# Patient Record
Sex: Male | Born: 1975 | Race: Black or African American | Hispanic: No | Marital: Single | Smoking: Former smoker
Health system: Southern US, Community
[De-identification: ages and names within clinical notes are randomized; demographics above are authoritative.]

---

## 2000-01-07 ENCOUNTER — Emergency Department (HOSPITAL_COMMUNITY): Admission: EM | Admit: 2000-01-07 | Discharge: 2000-01-07 | Payer: Self-pay | Admitting: Emergency Medicine

## 2000-01-13 ENCOUNTER — Emergency Department (HOSPITAL_COMMUNITY): Admission: EM | Admit: 2000-01-13 | Discharge: 2000-01-13 | Payer: Self-pay | Admitting: Emergency Medicine

## 2002-07-26 ENCOUNTER — Emergency Department (HOSPITAL_COMMUNITY): Admission: EM | Admit: 2002-07-26 | Discharge: 2002-07-26 | Payer: Self-pay | Admitting: Emergency Medicine

## 2002-07-26 ENCOUNTER — Encounter: Payer: Self-pay | Admitting: Emergency Medicine

## 2006-07-23 ENCOUNTER — Emergency Department (HOSPITAL_COMMUNITY): Admission: EM | Admit: 2006-07-23 | Discharge: 2006-07-24 | Payer: Self-pay | Admitting: Emergency Medicine

## 2007-08-04 IMAGING — CT CT HEAD W/O CM
1 series · 16 of 30 positions shown, 20 images · IV contrast (agent unspecified)
Comparison: None.

CLINICAL DATA: Altered level of consciousness.
 HEAD CT WITHOUT CONTRAST:
TECHNIQUE: Contiguous axial images were obtained from the base of the skull through the vertex according to standard protocol without contrast.

[Series 2: head_seq 4.5 h45s st · axial · 0.43mm/px · z∈[-171,-27]mm · 16 of 36 slices shown, 20 images]
[im 2/36  brain]
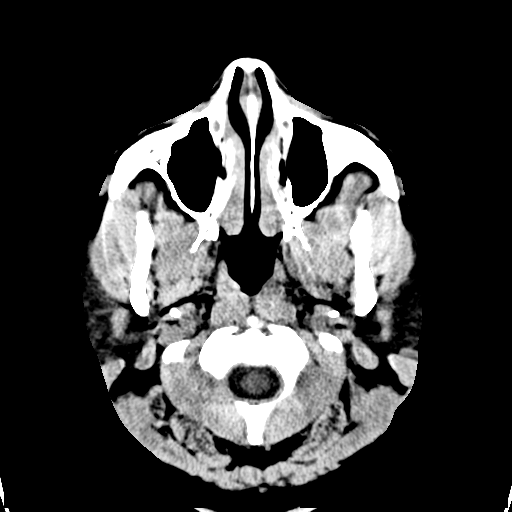
[im 2/36  bone]
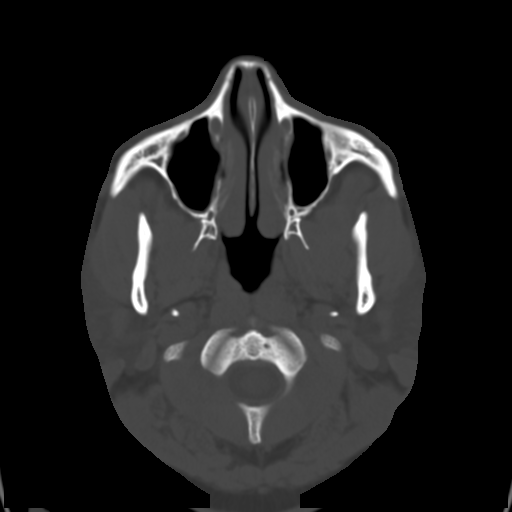
[im 4/36  brain]
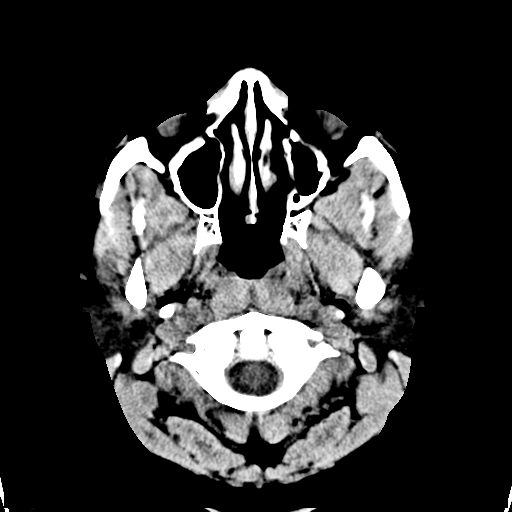
[im 7/36  brain]
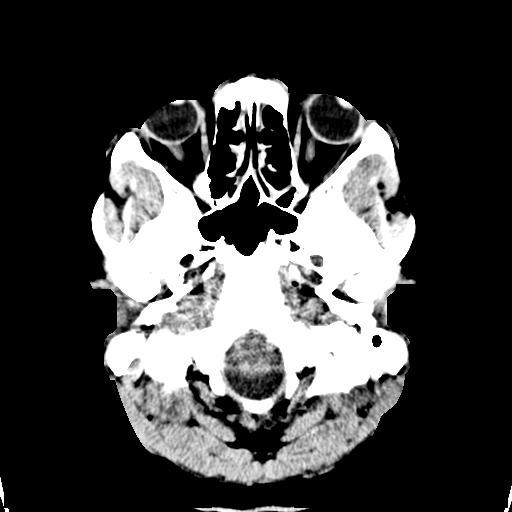
[im 9/36  brain]
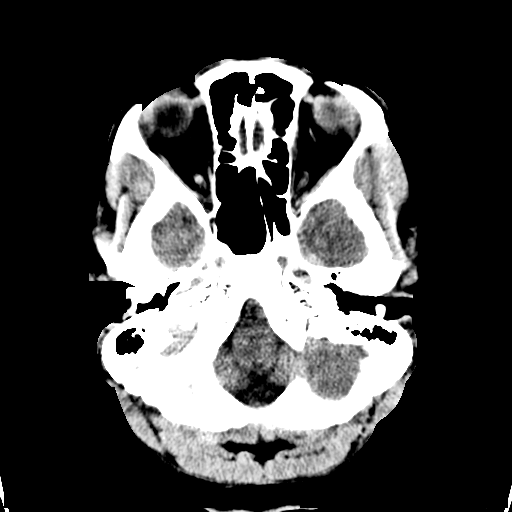
[im 10/36  brain]
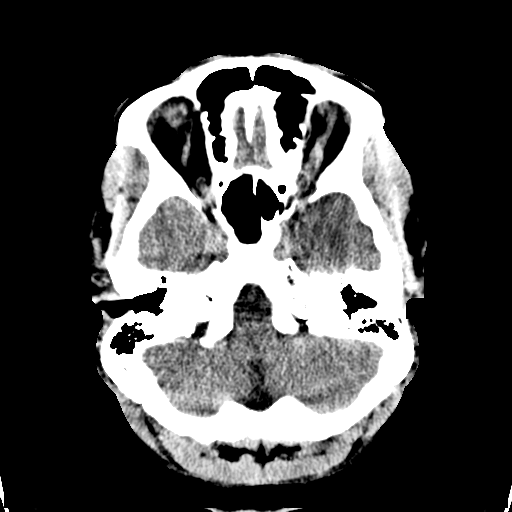
[im 10/36  bone]
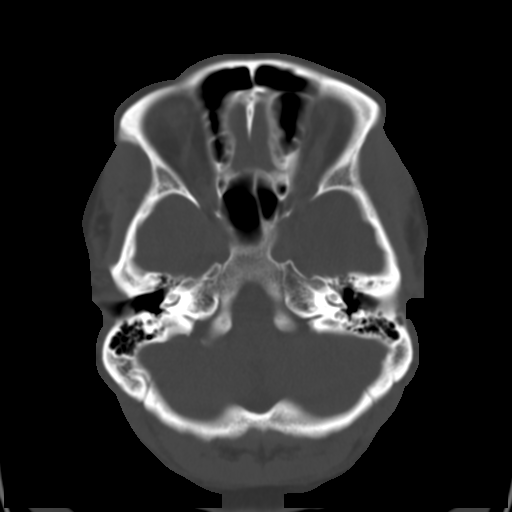
[im 13/36  brain]
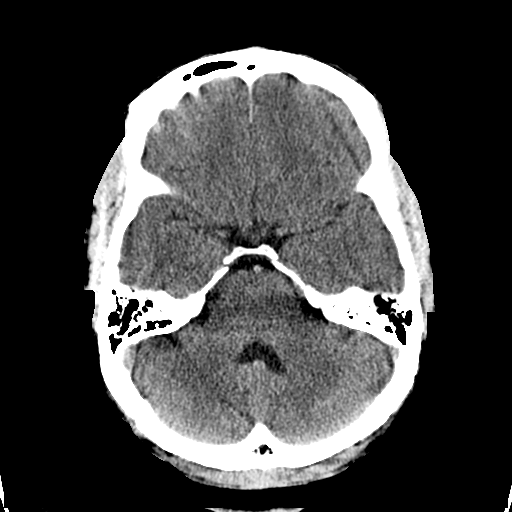
[im 15/36  brain]
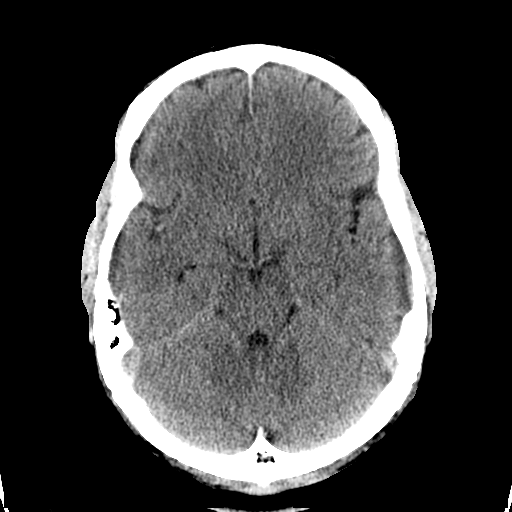
[im 17/36  brain]
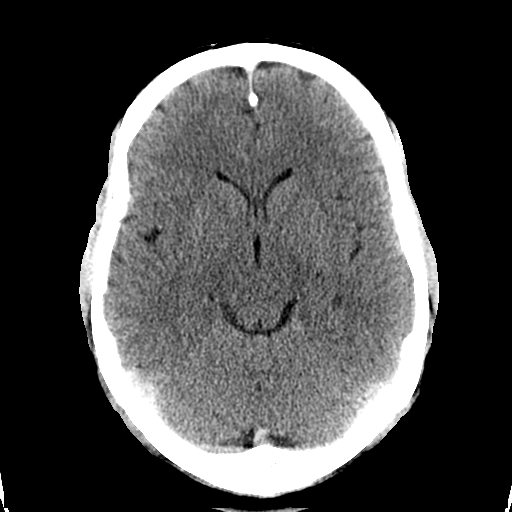
[im 19/36  brain]
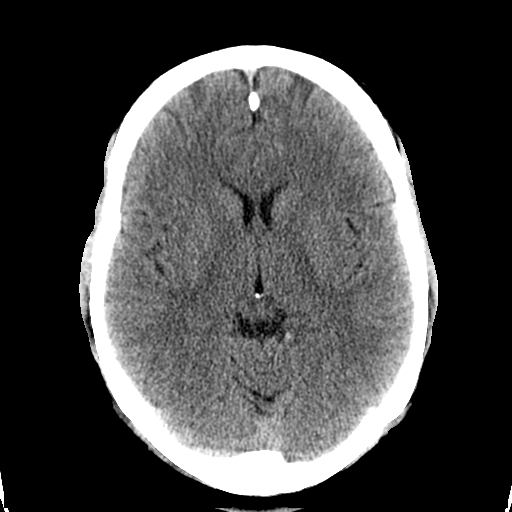
[im 19/36  bone]
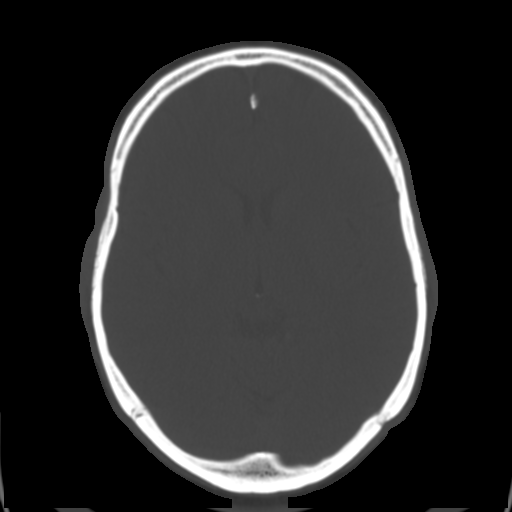
[im 21/36  brain]
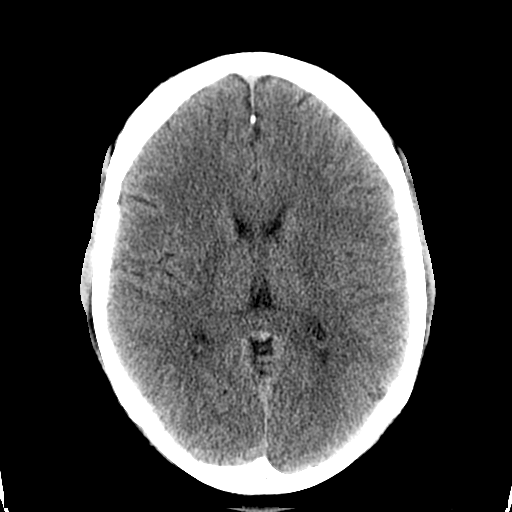
[im 23/36  brain]
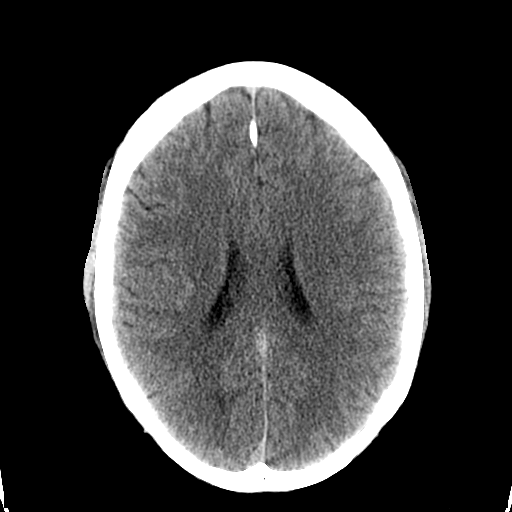
[im 26/36  brain]
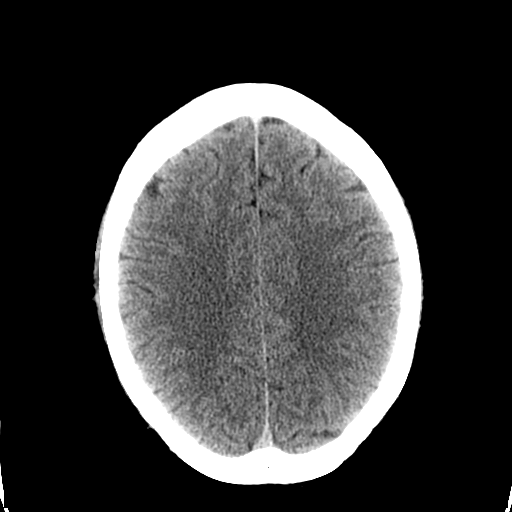
[im 27/36  brain]
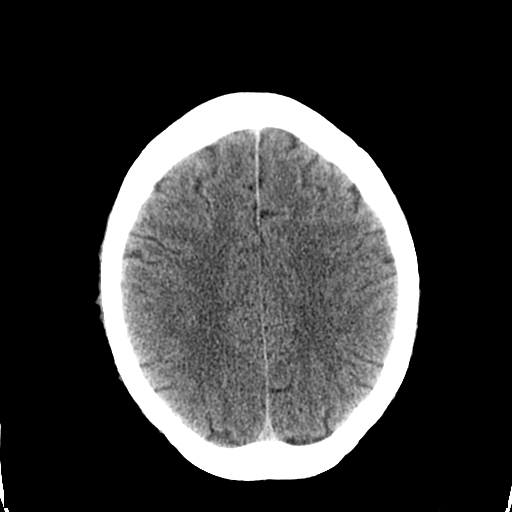
[im 27/36  bone]
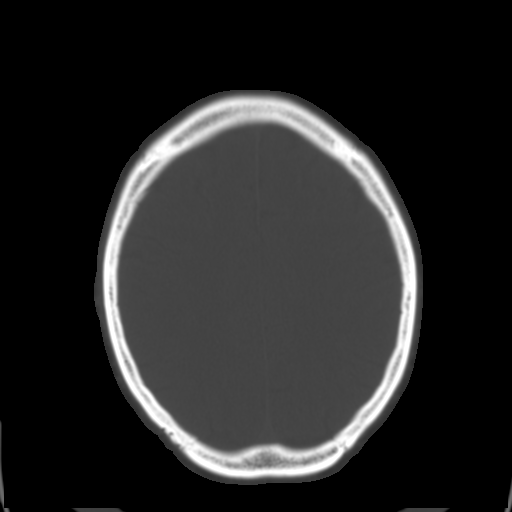
[im 29/36  brain]
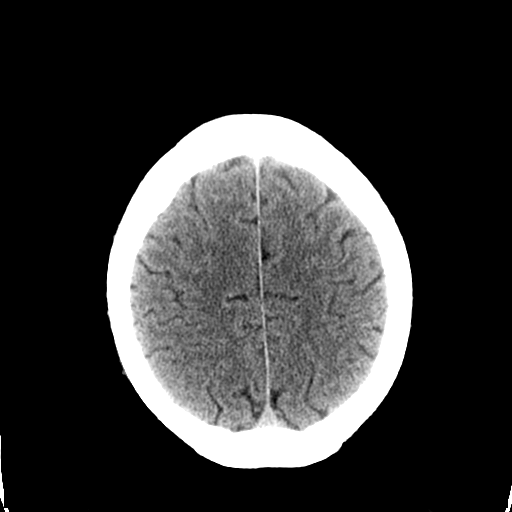
[im 32/36  brain]
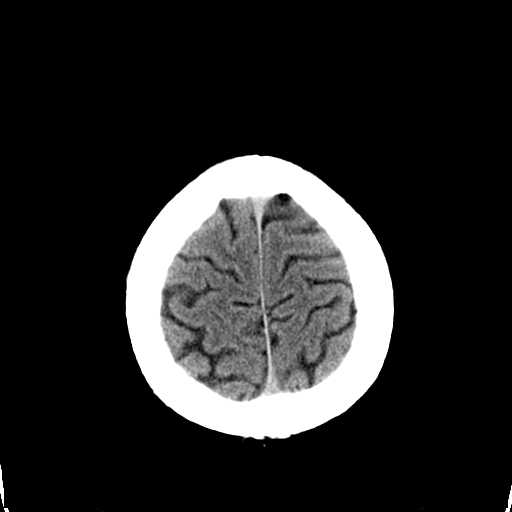
[im 34/36  brain]
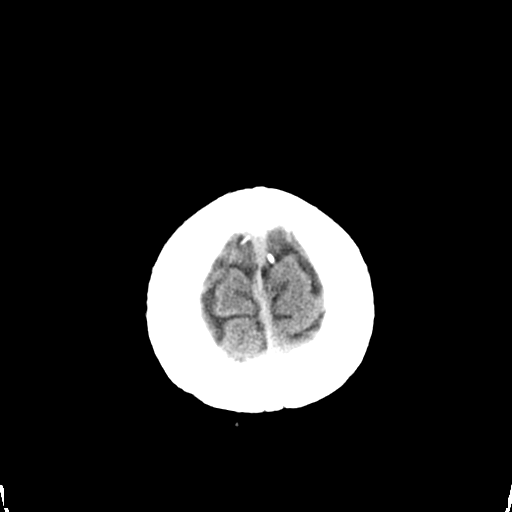

[16 of 30 positions shown; findings below may reference images not displayed]

FINDINGS: The brain appears normal without evidence of hemorrhage, infarct, mass, mass effect, midline shift or abnormal extraaxial fluid collection.  No hydrocephalus.  There is no fracture.  The patient has some mild ethmoid sinus disease.
IMPRESSION: 1. No intracranial abnormality.  
 2. Mild ethmoid sinus disease.

## 2008-01-22 ENCOUNTER — Emergency Department (HOSPITAL_COMMUNITY): Admission: EM | Admit: 2008-01-22 | Discharge: 2008-01-22 | Payer: Self-pay | Admitting: Emergency Medicine

## 2017-05-13 ENCOUNTER — Encounter (HOSPITAL_COMMUNITY): Payer: Self-pay | Admitting: Emergency Medicine

## 2017-05-13 ENCOUNTER — Emergency Department (HOSPITAL_COMMUNITY)
Admission: EM | Admit: 2017-05-13 | Discharge: 2017-05-13 | Disposition: A | Discharge status: Home / Self Care | Payer: No Typology Code available for payment source | Attending: Emergency Medicine | Admitting: Emergency Medicine

## 2017-05-13 DIAGNOSIS — Y999 Unspecified external cause status: Secondary | ICD-10-CM | POA: Insufficient documentation

## 2017-05-13 DIAGNOSIS — F17210 Nicotine dependence, cigarettes, uncomplicated: Secondary | ICD-10-CM | POA: Diagnosis not present

## 2017-05-13 DIAGNOSIS — Y92410 Unspecified street and highway as the place of occurrence of the external cause: Secondary | ICD-10-CM | POA: Diagnosis not present

## 2017-05-13 DIAGNOSIS — M542 Cervicalgia: Secondary | ICD-10-CM | POA: Diagnosis not present

## 2017-05-13 DIAGNOSIS — Y9389 Activity, other specified: Secondary | ICD-10-CM | POA: Diagnosis not present

## 2017-05-13 MED ORDER — NAPROXEN 500 MG PO TABS: 500.0000 mg | ORAL_TABLET | Freq: Two times a day (BID) | ORAL | 0 refills | Status: DC

## 2017-05-13 MED ORDER — CYCLOBENZAPRINE HCL 10 MG PO TABS: 10.0000 mg | ORAL_TABLET | Freq: Two times a day (BID) | ORAL | 0 refills | Status: DC | PRN
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
## 2017-05-13 NOTE — ED Notes (Signed)
ED Provider at bedside. 
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
## 2017-11-19 ENCOUNTER — Emergency Department (HOSPITAL_COMMUNITY): Payer: Self-pay

## 2017-11-19 ENCOUNTER — Emergency Department (HOSPITAL_COMMUNITY)
Admission: EM | Admit: 2017-11-19 | Discharge: 2017-11-19 | Disposition: A | Discharge status: Home / Self Care | Payer: Self-pay | Attending: Physician Assistant | Admitting: Physician Assistant

## 2017-11-19 ENCOUNTER — Other Ambulatory Visit: Payer: Self-pay

## 2017-11-19 DIAGNOSIS — J069 Acute upper respiratory infection, unspecified: Secondary | ICD-10-CM | POA: Insufficient documentation

## 2017-11-19 DIAGNOSIS — R0981 Nasal congestion: Secondary | ICD-10-CM | POA: Insufficient documentation

## 2017-11-19 DIAGNOSIS — F17210 Nicotine dependence, cigarettes, uncomplicated: Secondary | ICD-10-CM | POA: Insufficient documentation

## 2017-11-19 DIAGNOSIS — J029 Acute pharyngitis, unspecified: Secondary | ICD-10-CM | POA: Insufficient documentation

## 2017-11-19 LAB — RAPID STREP SCREEN (MED CTR MEBANE ONLY): Streptococcus, Group A Screen (Direct): NEGATIVE

## 2017-11-19 MED ORDER — BENZONATATE 100 MG PO CAPS: 100.0000 mg | ORAL_CAPSULE | Freq: Three times a day (TID) | ORAL | 0 refills | Status: DC | PRN

## 2017-11-19 MED ORDER — GUAIFENESIN-CODEINE 100-10 MG/5ML PO SOLN: 5.0000 mL | Freq: Four times a day (QID) | ORAL | 0 refills | Status: DC | PRN

## 2017-11-19 NOTE — ED Provider Notes (Signed)
[facility]-EMERGENCY DEPT Provider Note   CSN: [id number] Arrival date & time: 11/19/17  0908     History   Chief Complaint Chief Complaint  Patient presents with  . Cough  . Sore Throat    HPI Mike Vazquez is a 42 y.o. male.  HPI    42 year old male presenting with cough and sore throat.  He reports is been going on for several weeks.  He reports he and his wife had a similar viral illness a couple weeks ago.  It got a little better and then got acutely worse.  Patient reports that bad cough keeping up at night.  Mild drainage, sore throat.  No fevers.  No nausea no vomiting or diarrhea.  Has been taking over-the-counter medications with no effect.  No past medical history on file.  There are no active problems to display for this patient.   No past surgical history on file.      Home Medications    Prior to Admission medications   Medication Sig Start Date End Date Taking? Authorizing Provider  cyclobenzaprine (FLEXERIL) 10 MG tablet Take 1 tablet (10 mg total) by mouth 2 (two) times daily as needed for muscle spasms. 05/13/17  Yes Felicie MornSmith, David, NP  naproxen (NAPROSYN) 500 MG tablet Take 1 tablet (500 mg total) by mouth 2 (two) times daily. 05/13/17  Yes Felicie MornSmith, David, NP    Family History No family history on file.  Social History Social History   Tobacco Use  . Smoking status: Current Every Day Smoker    Packs/day: 0.50    Types: Cigarettes  . Smokeless tobacco: Never Used  Substance Use Topics  . Alcohol use: No  . Drug use: No     Allergies   Patient has no known allergies.   Review of Systems Review of Systems  Constitutional: Negative for activity change.  HENT: Positive for congestion and sore throat.   Respiratory: Positive for cough. Negative for shortness of breath.   Cardiovascular: Negative for chest pain.  Gastrointestinal: Negative for abdominal pain.  All other systems reviewed and are  negative.    Physical Exam Updated Vital Signs BP (!) 156/97 (BP Location: Left Arm)   Pulse 96   Temp 98.6 F (37 C) (Oral)   Resp 18   SpO2 95%   Physical Exam  Constitutional: He is oriented to person, place, and time. He appears well-nourished.  HENT:  Head: Normocephalic.  Right Ear: Tympanic membrane normal.  Left Ear: Tympanic membrane normal.  Mouth/Throat: Oropharynx is clear and moist. No uvula swelling. No posterior oropharyngeal edema or posterior oropharyngeal erythema.  Eyes: Pupils are equal, round, and reactive to light. Conjunctivae and EOM are normal.  Neck: Normal range of motion. Neck supple.  Cardiovascular: Normal rate.  Pulmonary/Chest: Effort normal.  Neurological: He is oriented to person, place, and time.  Skin: Skin is warm and dry. He is not diaphoretic.  Psychiatric: He has a normal mood and affect. His behavior is normal.  Nursing note and vitals reviewed.    ED Treatments / Results  Labs (all labs ordered are listed, but only abnormal results are displayed) Labs Reviewed  RAPID STREP SCREEN (NOT AT [facility])  CULTURE, GROUP A STREP [facility]    EKG None  Radiology No results found.  Procedures Procedures (including critical care time)  Medications Ordered in ED Medications - No data to display   Initial Impression / Assessment and Plan / ED Course  I have reviewed  the triage vital signs and the nursing notes.  Pertinent labs & imaging results that were available during my care of the patient were reviewed by me and considered in my medical decision making (see chart for details).      42 year old male presenting with cough and sore throat.  He reports is been going on for several weeks.  He reports he and his wife had a similar viral illness a couple weeks ago.  It got a little better and then got acutely worse.  Patient reports that bad cough keeping up at night.  Mild drainage, sore throat.  No fevers.  No nausea no vomiting or  diarrhea.  Has been taking over-the-counter medications with no effect.   Pt CXR negative for acute infiltrate. Patients symptoms are consistent with URI, likely viral etiology. Discussed that antibiotics are not indicated for viral infections. Pt will be discharged with symptomatic treatment.  Verbalizes understanding and is agreeable with plan. Pt is hemodynamically stable & in NAD prior to dc.  Will give cough medicine for daytime and nighttime.  Have him follow-up with primary care.  Patient appears very nontoxic, normal vital signs.  Final Clinical Impressions(s) / ED Diagnoses   Final diagnoses:  None    ED Discharge Orders    None       Abelino Derrick, MD 11/19/17 1616

## 2017-11-19 NOTE — Discharge Instructions (Signed)
We think you likely have a virus causing your symptoms.  Please use his symptomatic care.  Use ibuprofen and Tylenol.  Rest and drink plenty of fluids.

## 2017-11-19 NOTE — ED Triage Notes (Signed)
Pt reports URI s/s x3 weeks. Pt reports no resolution despite OTC. Pt A+OX4, NAD.

## 2017-11-21 LAB — CULTURE, GROUP A STREP (THRC)

## 2018-07-07 ENCOUNTER — Emergency Department (HOSPITAL_COMMUNITY): Payer: Self-pay

## 2018-07-07 ENCOUNTER — Encounter (HOSPITAL_COMMUNITY): Payer: Self-pay | Admitting: Emergency Medicine

## 2018-07-07 ENCOUNTER — Encounter (HOSPITAL_COMMUNITY): Payer: Self-pay | Admitting: *Deleted

## 2018-07-07 ENCOUNTER — Other Ambulatory Visit: Payer: Self-pay

## 2018-07-07 ENCOUNTER — Emergency Department (HOSPITAL_COMMUNITY)
Admission: EM | Admit: 2018-07-07 | Discharge: 2018-07-07 | Disposition: A | Discharge status: Home / Self Care | Payer: Self-pay | Attending: Emergency Medicine | Admitting: Emergency Medicine

## 2018-07-07 DIAGNOSIS — Y939 Activity, unspecified: Secondary | ICD-10-CM | POA: Insufficient documentation

## 2018-07-07 DIAGNOSIS — S0183XA Puncture wound without foreign body of other part of head, initial encounter: Secondary | ICD-10-CM

## 2018-07-07 DIAGNOSIS — S0182XA Laceration with foreign body of other part of head, initial encounter: Secondary | ICD-10-CM | POA: Insufficient documentation

## 2018-07-07 DIAGNOSIS — S0181XA Laceration without foreign body of other part of head, initial encounter: Secondary | ICD-10-CM

## 2018-07-07 DIAGNOSIS — W3400XA Accidental discharge from unspecified firearms or gun, initial encounter: Secondary | ICD-10-CM | POA: Insufficient documentation

## 2018-07-07 DIAGNOSIS — Y999 Unspecified external cause status: Secondary | ICD-10-CM | POA: Insufficient documentation

## 2018-07-07 DIAGNOSIS — Z23 Encounter for immunization: Secondary | ICD-10-CM | POA: Insufficient documentation

## 2018-07-07 DIAGNOSIS — Y929 Unspecified place or not applicable: Secondary | ICD-10-CM | POA: Insufficient documentation

## 2018-07-07 LAB — COMPREHENSIVE METABOLIC PANEL
ALBUMIN: 3.8 g/dL (ref 3.5–5.0)
ALK PHOS: 39 U/L (ref 38–126)
ALT: 24 U/L (ref 0–44)
AST: 12 U/L — AB (ref 15–41)
Anion gap: 8 (ref 5–15)
BILIRUBIN TOTAL: 1.4 mg/dL — AB (ref 0.3–1.2)
BUN: 19 mg/dL (ref 6–20)
CALCIUM: 9.1 mg/dL (ref 8.9–10.3)
CO2: 24 mmol/L (ref 22–32)
CREATININE: 1.3 mg/dL — AB (ref 0.61–1.24)
Chloride: 108 mmol/L (ref 98–111)
GFR calc Af Amer: >60 mL/min (ref >60)
GLUCOSE: 112 mg/dL — AB (ref 70–99)
POTASSIUM: 3.5 mmol/L (ref 3.5–5.1)
Sodium: 140 mmol/L (ref 135–145)
TOTAL PROTEIN: 6.6 g/dL (ref 6.5–8.1)

## 2018-07-07 LAB — CDS SEROLOGY

## 2018-07-07 LAB — CBC
HCT: 44.4 % (ref 39.0–52.0)
HEMOGLOBIN: 14.2 g/dL (ref 13.0–17.0)
MCH: 30.5 pg (ref 26.0–34.0)
MCHC: 32 g/dL (ref 30.0–36.0)
MCV: 95.5 fL (ref 80.0–100.0)
Platelets: 273 10*3/uL (ref 150–400)
RBC: 4.65 MIL/uL (ref 4.22–5.81)
RDW: 12.2 % (ref 11.5–15.5)
WBC: 6.3 10*3/uL (ref 4.0–10.5)
nRBC: 0 % (ref 0.0–0.2)

## 2018-07-07 LAB — ABO/RH: ABO/RH(D): O POS

## 2018-07-07 LAB — PROTIME-INR
INR: 1
Prothrombin Time: 13.1 seconds (ref 11.4–15.2)

## 2018-07-07 LAB — ETHANOL

## 2018-07-07 MED ORDER — LIDOCAINE-EPINEPHRINE (PF) 2 %-1:200000 IJ SOLN
10.0000 mL | Freq: Once | INTRAMUSCULAR | Status: AC
  Administered 2018-07-07: 10 mL
  Filled 2018-07-07: qty 10

## 2018-07-07 MED ORDER — CEPHALEXIN 500 MG PO CAPS: 500.0000 mg | ORAL_CAPSULE | Freq: Three times a day (TID) | ORAL | 0 refills | Status: DC

## 2018-07-07 MED ORDER — TETANUS-DIPHTH-ACELL PERTUSSIS 5-2.5-18.5 LF-MCG/0.5 IM SUSP
0.5000 mL | Freq: Once | INTRAMUSCULAR | Status: AC
  Administered 2018-07-07: 0.5 mL via INTRAMUSCULAR
  Filled 2018-07-07: qty 0.5

## 2018-07-07 MED ORDER — CEPHALEXIN 250 MG PO CAPS
1000.0000 mg | ORAL_CAPSULE | Freq: Once | ORAL | Status: AC
  Administered 2018-07-07: 1000 mg via ORAL
  Filled 2018-07-07: qty 4

## 2018-07-07 NOTE — ED Notes (Signed)
csi at the bedside
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
## 2018-07-07 NOTE — ED Notes (Signed)
Laceration cleaned with soap and water  dsd applied 
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
## 2018-07-07 NOTE — ED Notes (Signed)
bp 135/90

## 2018-07-07 NOTE — ED Notes (Signed)
Pt to ct 

## 2018-07-07 NOTE — ED Notes (Signed)
Portable  Chest xrays and pelvis
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
## 2018-07-07 NOTE — Discharge Instructions (Addendum)
Take acetaminophen or ibuprofen as needed for pain.  Return if signs of infection appear.

## 2018-07-08 LAB — BPAM RBC
BLOOD PRODUCT EXPIRATION DATE: 201912152359
BLOOD PRODUCT EXPIRATION DATE: 201912192359
ISSUE DATE / TIME: 201911210730
ISSUE DATE / TIME: 201911210833
UNIT TYPE AND RH: 5100
Unit Type and Rh: 5100

## 2018-07-08 LAB — TYPE AND SCREEN
ABO/RH(D): O POS
Antibody Screen: NEGATIVE
UNIT DIVISION: 0
Unit division: 0

## 2018-07-11 ENCOUNTER — Encounter (HOSPITAL_COMMUNITY): Payer: Self-pay

## 2018-07-11 ENCOUNTER — Emergency Department (HOSPITAL_COMMUNITY)
Admission: EM | Admit: 2018-07-11 | Discharge: 2018-07-11 | Disposition: A | Discharge status: Home / Self Care | Payer: Self-pay | Attending: Emergency Medicine | Admitting: Emergency Medicine

## 2018-07-11 DIAGNOSIS — F17210 Nicotine dependence, cigarettes, uncomplicated: Secondary | ICD-10-CM | POA: Insufficient documentation

## 2018-07-11 DIAGNOSIS — S0181XD Laceration without foreign body of other part of head, subsequent encounter: Secondary | ICD-10-CM | POA: Insufficient documentation

## 2018-07-11 DIAGNOSIS — W3400XD Accidental discharge from unspecified firearms or gun, subsequent encounter: Secondary | ICD-10-CM | POA: Insufficient documentation

## 2018-07-11 DIAGNOSIS — Z79899 Other long term (current) drug therapy: Secondary | ICD-10-CM | POA: Insufficient documentation

## 2018-07-11 DIAGNOSIS — Z4802 Encounter for removal of sutures: Secondary | ICD-10-CM | POA: Insufficient documentation
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
## 2018-07-11 NOTE — Discharge Instructions (Addendum)
It is normal to have oozing of blood for a day or 2 after your stitches are removed.  If you develop new redness around the wound, have significant swelling or concerns please seek additional medical care and evaluation.  I put on Steri-Strips today to help reinforce the wound and keep it from opening up.  Once these fall off you do not have to put new ones on.  I have given you follow-up with the concussion clinic as needed.  Please call them for an appointment.  If you have any new or concerning symptoms please seek additional medical care and evaluation.
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
## 2018-07-19 ENCOUNTER — Telehealth: Payer: Self-pay | Admitting: General Practice

## 2018-07-19 NOTE — Telephone Encounter (Signed)
Patient was seen at hospital on 11/20. The hospital wrote the patient out of work for 2 weeks. He is requesting to have his FMLA completed and cleared for work. The hospital informed patient he would need to follow up at [facility] with Dr.Smith to have this done, They are unable to do so.   Patient does not have insurance, but can pay the $80 and is aware of that - along with he will get a bill for the rest.   Please advise.

## 2018-07-20 ENCOUNTER — Telehealth: Payer: Self-pay

## 2018-07-20 ENCOUNTER — Ambulatory Visit: Payer: Self-pay | Admitting: Family Medicine

## 2018-07-20 ENCOUNTER — Ambulatory Visit (INDEPENDENT_AMBULATORY_CARE_PROVIDER_SITE_OTHER): Payer: Self-pay | Admitting: Family Medicine

## 2018-07-20 ENCOUNTER — Encounter: Payer: Self-pay | Admitting: Family Medicine

## 2018-07-20 DIAGNOSIS — S0990XA Unspecified injury of head, initial encounter: Secondary | ICD-10-CM

## 2018-07-20 NOTE — Telephone Encounter (Signed)
Scheduled

## 2018-07-20 NOTE — Patient Instructions (Signed)
Good to see you  You will be good to go on Thursday  Please do not hesitate to call us at [phone] if you need us for anything  Happy holidays!  See me otherwise if you need me

## 2018-07-20 NOTE — Telephone Encounter (Signed)
Lets go for 1015 today
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
## 2018-08-15 DIAGNOSIS — Z0279 Encounter for issue of other medical certificate: Secondary | ICD-10-CM

## 2019-10-26 ENCOUNTER — Other Ambulatory Visit: Payer: Self-pay

## 2019-10-26 ENCOUNTER — Encounter (HOSPITAL_COMMUNITY): Payer: Self-pay

## 2019-10-26 ENCOUNTER — Emergency Department (HOSPITAL_COMMUNITY)
Admission: EM | Admit: 2019-10-26 | Discharge: 2019-10-26 | Disposition: A | Discharge status: Home / Self Care | Payer: Self-pay | Attending: Emergency Medicine | Admitting: Emergency Medicine

## 2019-10-26 DIAGNOSIS — Z791 Long term (current) use of non-steroidal anti-inflammatories (NSAID): Secondary | ICD-10-CM | POA: Insufficient documentation

## 2019-10-26 DIAGNOSIS — Z79899 Other long term (current) drug therapy: Secondary | ICD-10-CM | POA: Insufficient documentation

## 2019-10-26 DIAGNOSIS — L0291 Cutaneous abscess, unspecified: Secondary | ICD-10-CM

## 2019-10-26 DIAGNOSIS — L02212 Cutaneous abscess of back [any part, except buttock]: Secondary | ICD-10-CM | POA: Insufficient documentation

## 2019-10-26 DIAGNOSIS — F17210 Nicotine dependence, cigarettes, uncomplicated: Secondary | ICD-10-CM | POA: Insufficient documentation

## 2019-10-26 MED ORDER — BACITRACIN ZINC 500 UNIT/GM EX OINT
TOPICAL_OINTMENT | CUTANEOUS | Status: AC
  Filled 2019-10-26: qty 0.9

## 2019-10-26 MED ORDER — LIDOCAINE-EPINEPHRINE (PF) 2 %-1:200000 IJ SOLN: 10.0000 mL | Freq: Once | INTRAMUSCULAR | Status: DC

## 2019-10-26 MED ORDER — DOXYCYCLINE HYCLATE 100 MG PO CAPS: 100.0000 mg | ORAL_CAPSULE | Freq: Two times a day (BID) | ORAL | 0 refills | Status: DC

## 2019-10-26 MED ORDER — DOXYCYCLINE HYCLATE 100 MG PO TABS
100.0000 mg | ORAL_TABLET | Freq: Once | ORAL | Status: AC
  Administered 2019-10-26: 100 mg via ORAL
  Filled 2019-10-26: qty 1

## 2019-10-26 NOTE — ED Triage Notes (Signed)
Pt reports a "bump" on his back under his L shoulder blade that popped and started draining yesterday. He states that it was draining purulent blood. States that it has stopped draining, but is sore.

## 2019-10-26 NOTE — ED Provider Notes (Signed)
[facility]-EMERGENCY DEPT Provider Note   CSN: [id number] Arrival date & time: 10/26/19  0038     History Chief Complaint  Patient presents with  . Abscess    Mike Vazquez is a 44 y.o. male.  The history is provided by the patient. No language interpreter was used.  Abscess Location:  Torso Torso abscess location:  Upper back Size:  2x1 cm Abscess quality: draining, fluctuance and painful   Red streaking: no   Duration:  1 day Progression:  Worsening Pain details:    Quality:  Pressure   Severity:  Moderate   Timing:  Constant   Progression:  Worsening Chronicity:  New Context: insect bite/sting (possible)   Context: not diabetes, not immunosuppression, not injected drug use and not skin injury   Relieved by:  Nothing Worsened by:  Nothing Ineffective treatments:  None tried Associated symptoms: no fever, no nausea and no vomiting        History reviewed. No pertinent past medical history.  Patient Active Problem List   Diagnosis Date Noted  . Head injury 07/20/2018    History reviewed. No pertinent surgical history.     History reviewed. No pertinent family history.  Social History   Tobacco Use  . Smoking status: Current Every Day Smoker    Packs/day: 0.50    Types: Cigarettes  . Smokeless tobacco: Never Used  Substance Use Topics  . Alcohol use: Never  . Drug use: No    Home Medications Prior to Admission medications   Medication Sig Start Date End Date Taking? Authorizing Provider  benzonatate (TESSALON PERLES) 100 MG capsule Take 1 capsule (100 mg total) by mouth 3 (three) times daily as needed for cough. 11/19/17   Mackuen, Courteney Lyn, MD  cephALEXin (KEFLEX) 500 MG capsule Take 1 capsule (500 mg total) by mouth 3 (three) times daily. 07/07/18   Dione Booze, MD  cyclobenzaprine (FLEXERIL) 10 MG tablet Take 1 tablet (10 mg total) by mouth 2 (two) times daily as needed for muscle spasms. 05/13/17   Felicie Morn, NP    doxycycline (VIBRAMYCIN) 100 MG capsule Take 1 capsule (100 mg total) by mouth 2 (two) times daily. 10/26/19   Roxy Horseman, PA-C  guaiFENesin-codeine 100-10 MG/5ML syrup Take 5 mLs by mouth every 6 (six) hours as needed for cough. 11/19/17   Mackuen, Courteney Lyn, MD  naproxen (NAPROSYN) 500 MG tablet Take 1 tablet (500 mg total) by mouth 2 (two) times daily. 05/13/17   Felicie Morn, NP    Allergies    Patient has no known allergies.  Review of Systems   Review of Systems  Constitutional: Negative for chills and fever.  Gastrointestinal: Negative for nausea and vomiting.  Skin: Positive for color change and wound.    Physical Exam Updated Vital Signs BP (!) 141/96 (BP Location: Right Arm)   Pulse 88   Temp 98.8 F (37.1 C) (Oral)   Resp 14   Ht 5\' 8"  (1.727 m)   Wt 86.2 kg   SpO2 96%   BMI 28.89 kg/m   Physical Exam Vitals and nursing note reviewed.  Constitutional:      General: He is not in acute distress.    Appearance: He is well-developed. He is not ill-appearing.  HENT:     Head: Normocephalic and atraumatic.  Eyes:     Conjunctiva/sclera: Conjunctivae normal.  Cardiovascular:     Rate and Rhythm: Normal rate.  Pulmonary:     Effort: Pulmonary effort is  normal. No respiratory distress.  Abdominal:     General: There is no distension.  Musculoskeletal:     Cervical back: Neck supple.     Comments: Moves all extremities  Skin:    General: Skin is warm and dry.     Comments: 2x1cm draining abscess to left upper back with mild cellulitic changes  Neurological:     Mental Status: He is alert and oriented to person, place, and time.  Psychiatric:        Mood and Affect: Mood normal.        Behavior: Behavior normal.     ED Results / Procedures / Treatments   Labs (all labs ordered are listed, but only abnormal results are displayed) Labs Reviewed - No data to display  EKG None  Radiology No results found.  Procedures .Marland KitchenIncision and  Drainage  Date/Time: 10/26/2019 3:02 AM Performed by: Montine Circle, PA-C Authorized by: Montine Circle, PA-C   Consent:    Consent obtained:  Verbal   Consent given by:  Patient   Risks discussed:  Bleeding, incomplete drainage, pain, damage to other organs and infection   Alternatives discussed:  No treatment Universal protocol:    Procedure explained and questions answered to patient or proxy's satisfaction: yes     Relevant documents present and verified: yes     Test results available and properly labeled: yes     Imaging studies available: yes     Required blood products, implants, devices, and special equipment available: yes     Site/side marked: yes     Immediately prior to procedure a time out was called: yes     Patient identity confirmed:  Verbally with patient Location:    Type:  Abscess   Size:  2x1cm   Location:  Trunk   Trunk location:  Back Procedure type:    Complexity:  Simple Procedure details:    Drainage:  Purulent   Drainage amount:  Moderate Post-procedure details:    Patient tolerance of procedure:  Tolerated well, no immediate complications Comments:     No incision made.  Draining already.  Moderate amount of purulent discharge was expressed with pressure.   (including critical care time)  Medications Ordered in ED Medications  doxycycline (VIBRA-TABS) tablet 100 mg (has no administration in time range)  bacitracin 500 UNIT/GM ointment (  Given 10/26/19 0226)    ED Course  I have reviewed the triage vital signs and the nursing notes.  Pertinent labs & imaging results that were available during my care of the patient were reviewed by me and considered in my medical decision making (see chart for details).    MDM Rules/Calculators/A&P                       Patient with draining abscess to his left upper back.  I was able to express a significant amount of purulent discharge.  Patient given doxycycline.  Return precautions given. Final  Clinical Impression(s) / ED Diagnoses Final diagnoses:  Abscess    Rx / DC Orders ED Discharge Orders         Ordered    doxycycline (VIBRAMYCIN) 100 MG capsule  2 times daily     10/26/19 0226           Montine Circle, PA-C 10/26/19 0305    Palumbo, April, MD 10/26/19 [phone]

## 2020-08-24 ENCOUNTER — Emergency Department (HOSPITAL_COMMUNITY): Payer: HRSA Program

## 2020-08-24 ENCOUNTER — Other Ambulatory Visit: Payer: Self-pay

## 2020-08-24 ENCOUNTER — Encounter (HOSPITAL_COMMUNITY): Payer: Self-pay | Admitting: *Deleted

## 2020-08-24 ENCOUNTER — Emergency Department (HOSPITAL_COMMUNITY)
Admission: EM | Admit: 2020-08-24 | Discharge: 2020-08-24 | Disposition: A | Discharge status: Home / Self Care | Payer: HRSA Program | Attending: Emergency Medicine | Admitting: Emergency Medicine

## 2020-08-24 DIAGNOSIS — J1282 Pneumonia due to coronavirus disease 2019: Secondary | ICD-10-CM | POA: Diagnosis not present

## 2020-08-24 DIAGNOSIS — R509 Fever, unspecified: Secondary | ICD-10-CM | POA: Diagnosis present

## 2020-08-24 DIAGNOSIS — U071 COVID-19: Secondary | ICD-10-CM | POA: Diagnosis not present

## 2020-08-24 DIAGNOSIS — Z87891 Personal history of nicotine dependence: Secondary | ICD-10-CM | POA: Insufficient documentation

## 2020-08-24 LAB — CBC WITH DIFFERENTIAL/PLATELET
Abs Immature Granulocytes: 0.02 10*3/uL (ref 0.00–0.07)
Basophils Absolute: 0 10*3/uL (ref 0.0–0.1)
Basophils Relative: 0 %
Eosinophils Absolute: 0 10*3/uL (ref 0.0–0.5)
Eosinophils Relative: 0 %
HCT: 46.2 % (ref 39.0–52.0)
Hemoglobin: 15.8 g/dL (ref 13.0–17.0)
Immature Granulocytes: 0 %
Lymphocytes Relative: 18 %
Lymphs Abs: 0.9 10*3/uL (ref 0.7–4.0)
MCH: 32 pg (ref 26.0–34.0)
MCHC: 34.2 g/dL (ref 30.0–36.0)
MCV: 93.5 fL (ref 80.0–100.0)
Monocytes Absolute: 0.3 10*3/uL (ref 0.1–1.0)
Monocytes Relative: 6 %
Neutro Abs: 3.8 10*3/uL (ref 1.7–7.7)
Neutrophils Relative %: 76 %
Platelets: 202 10*3/uL (ref 150–400)
RBC: 4.94 MIL/uL (ref 4.22–5.81)
RDW: 11.7 % (ref 11.5–15.5)
WBC: 5 10*3/uL (ref 4.0–10.5)
nRBC: 0 % (ref 0.0–0.2)

## 2020-08-24 LAB — COMPREHENSIVE METABOLIC PANEL
ALT: 32 U/L (ref 0–44)
AST: 47 U/L — ABNORMAL HIGH (ref 15–41)
Albumin: 3.6 g/dL (ref 3.5–5.0)
Alkaline Phosphatase: 39 U/L (ref 38–126)
Anion gap: 9 (ref 5–15)
BUN: 12 mg/dL (ref 6–20)
CO2: 28 mmol/L (ref 22–32)
Calcium: 8.5 mg/dL — ABNORMAL LOW (ref 8.9–10.3)
Chloride: 99 mmol/L (ref 98–111)
Creatinine, Ser: 1.08 mg/dL (ref 0.61–1.24)
GFR, Estimated: >60 mL/min (ref >60)
Glucose, Bld: 116 mg/dL — ABNORMAL HIGH (ref 70–99)
Potassium: 3.8 mmol/L (ref 3.5–5.1)
Sodium: 136 mmol/L (ref 135–145)
Total Bilirubin: 1.2 mg/dL (ref 0.3–1.2)
Total Protein: 7.7 g/dL (ref 6.5–8.1)

## 2020-08-24 LAB — SARS CORONAVIRUS 2 (TAT 6-24 HRS): SARS Coronavirus 2: POSITIVE — AB

## 2020-08-24 LAB — POC SARS CORONAVIRUS 2 AG -  ED
SARS Coronavirus 2 Ag: POSITIVE — AB
SARS Coronavirus 2 Ag: POSITIVE — AB

## 2020-08-24 MED ORDER — ACETAMINOPHEN 325 MG PO TABS
650.0000 mg | ORAL_TABLET | Freq: Once | ORAL | Status: AC | PRN
  Administered 2020-08-24: 650 mg via ORAL
  Filled 2020-08-24: qty 2

## 2020-08-24 MED ORDER — BENZONATATE 100 MG PO CAPS: 100.0000 mg | ORAL_CAPSULE | Freq: Three times a day (TID) | ORAL | 0 refills | Status: AC | PRN

## 2020-08-24 MED ORDER — SODIUM CHLORIDE 0.9 % IV BOLUS: 1000.0000 mL | Freq: Once | INTRAVENOUS | Status: DC

## 2020-08-24 MED ORDER — BENZONATATE 100 MG PO CAPS: 100.0000 mg | ORAL_CAPSULE | Freq: Three times a day (TID) | ORAL | 0 refills | Status: DC | PRN
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
## 2020-08-24 NOTE — ED Triage Notes (Signed)
Cough, fever, body aches, since Thursday

## 2020-08-24 NOTE — Discharge Instructions (Signed)
If you develop high fever, severe cough or cough with blood, trouble breathing, severe headache, neck pain/stiffness, vomiting, or any other new/concerning symptoms then return to the ER for evaluation  

## 2021-09-04 IMAGING — DX DG CHEST 1V PORT
1 series · 1 of 1 positions shown · non-contrast
Comparison: Chest radiograph dated 11/19/2017

CLINICAL DATA: 44-year-old male with cough and fever

EXAM:
PORTABLE CHEST 1 VIEW

[chest ap]
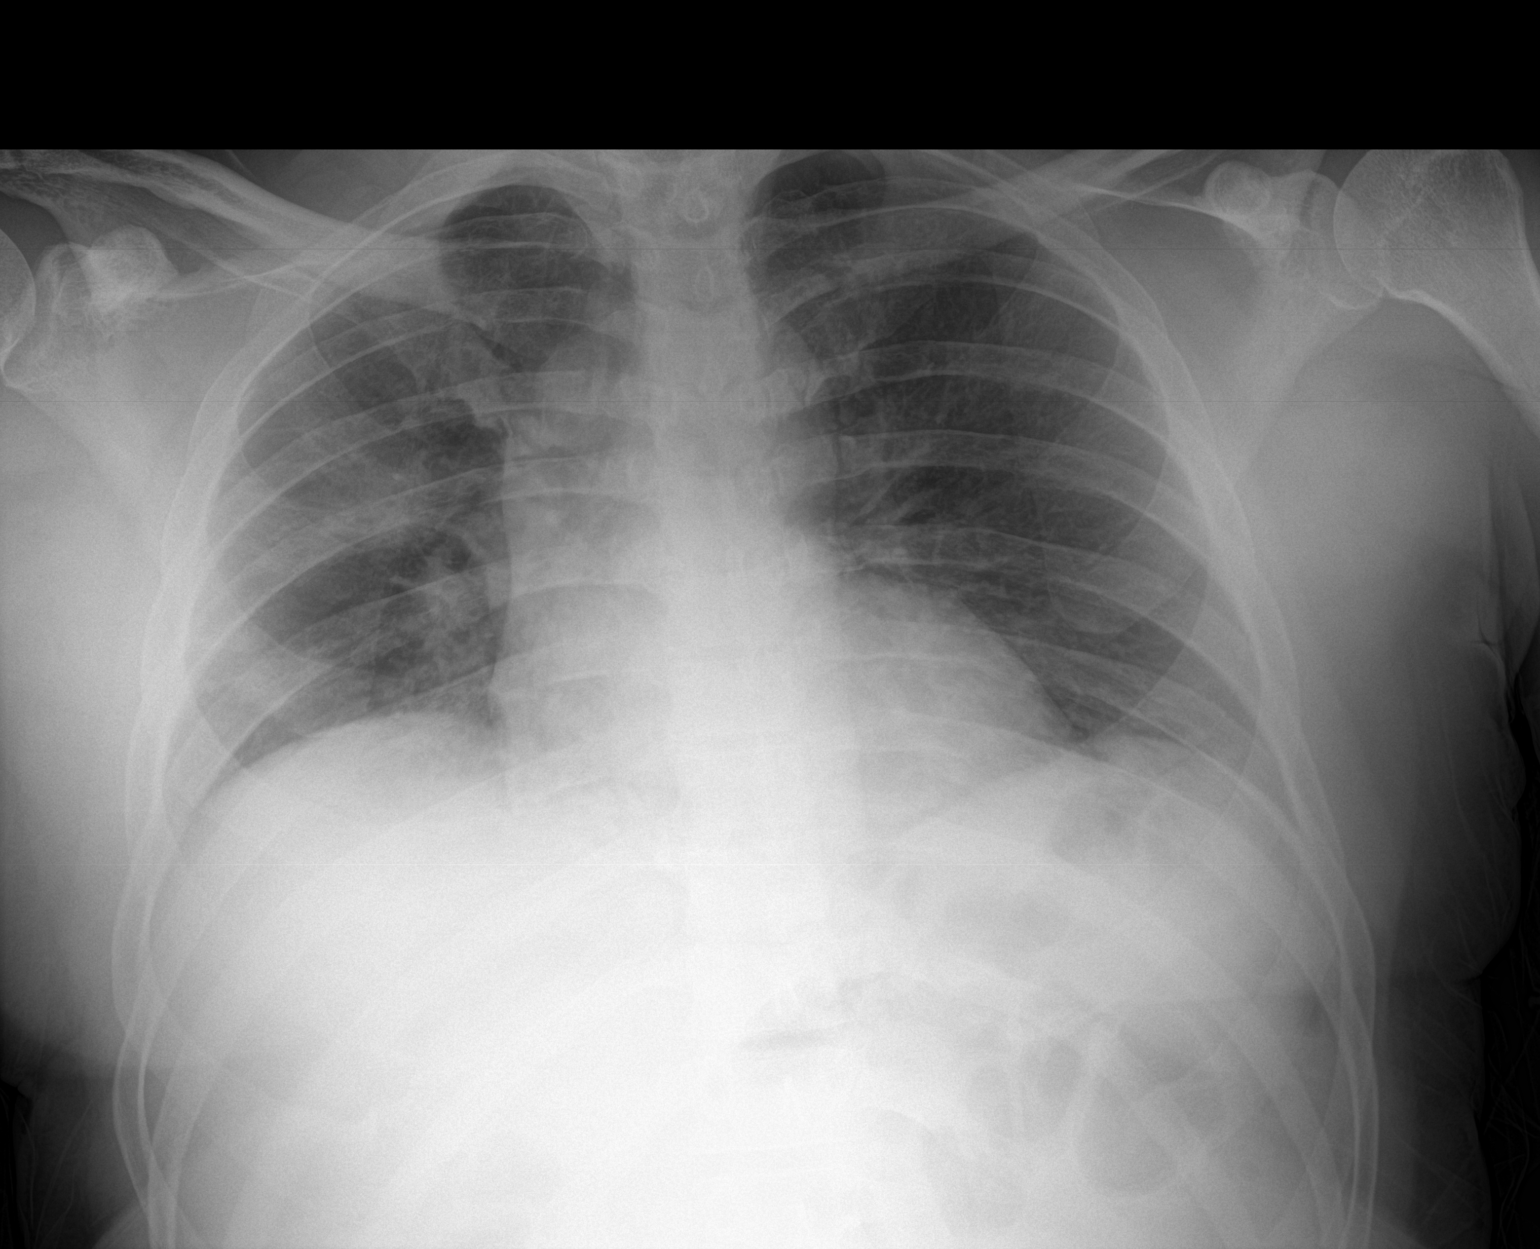

[1 of 1 positions shown; findings below may reference images not displayed]

FINDINGS: Bilateral faint pulmonary opacities consistent with multifocal
pneumonia, likely viral or atypical in etiology including [id number].
Clinical correlation recommended. No pleural effusion pneumothorax.
The cardiac silhouette is within limits. No acute osseous pathology.
IMPRESSION: Multifocal pneumonia.

## 2023-05-28 ENCOUNTER — Other Ambulatory Visit: Payer: Self-pay

## 2023-05-28 ENCOUNTER — Emergency Department (HOSPITAL_BASED_OUTPATIENT_CLINIC_OR_DEPARTMENT_OTHER)
Admission: EM | Admit: 2023-05-28 | Discharge: 2023-05-28 | Disposition: A | Discharge status: Home / Self Care | Payer: Self-pay | Attending: Emergency Medicine | Admitting: Emergency Medicine

## 2023-05-28 ENCOUNTER — Encounter (HOSPITAL_BASED_OUTPATIENT_CLINIC_OR_DEPARTMENT_OTHER): Payer: Self-pay | Admitting: Emergency Medicine

## 2023-05-28 DIAGNOSIS — L02212 Cutaneous abscess of back [any part, except buttock]: Secondary | ICD-10-CM | POA: Insufficient documentation

## 2023-05-28 DIAGNOSIS — L03312 Cellulitis of back [any part except buttock]: Secondary | ICD-10-CM | POA: Insufficient documentation

## 2023-05-28 DIAGNOSIS — L0291 Cutaneous abscess, unspecified: Secondary | ICD-10-CM

## 2023-05-28 MED ORDER — LIDOCAINE-EPINEPHRINE (PF) 2 %-1:200000 IJ SOLN
10.0000 mL | Freq: Once | INTRAMUSCULAR | Status: AC
  Administered 2023-05-28: 10 mL via INTRADERMAL
  Filled 2023-05-28: qty 20

## 2023-05-28 MED ORDER — DOXYCYCLINE HYCLATE 100 MG PO CAPS: 100.0000 mg | ORAL_CAPSULE | Freq: Two times a day (BID) | ORAL | 0 refills | Status: DC

## 2023-05-28 NOTE — ED Triage Notes (Signed)
Pt arrived POV from home, caox4, ambulatory, NAD c/o possible abscess to L upper back. Pt reports in 2021 he had the same spot that had to be drained and was told it was a possible spider bite. Pt reports the spot has been a "keloid" since but he noticed a few days ago he noticed it appeared to be protruding in the same spot with pain on palpation and movement. Pt denies fever, weakness, or any other complaints.

## 2023-05-28 NOTE — Discharge Instructions (Signed)
Your history, exam, and evaluation today are consistent with a recurrent abscess on your back.  It was large and somewhat cystlike as we discussed but we got a large amount of purulence/pus out of it.  With the surrounding tenderness and inflammation we will start you on antibiotics and please take them.  Please follow-up with a primary doctor.  The packing will likely fall in the next day or 2 which is fine and it will continue to drain.  Please rest and stay hydrated.  If any symptoms change or worsen acutely, return to the nearest emergency department.  Please follow-up as in the future you may end up needing removal surgically if it continues to recur.
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
## 2023-06-03 ENCOUNTER — Other Ambulatory Visit (HOSPITAL_BASED_OUTPATIENT_CLINIC_OR_DEPARTMENT_OTHER): Payer: Self-pay

## 2023-06-03 ENCOUNTER — Encounter (HOSPITAL_BASED_OUTPATIENT_CLINIC_OR_DEPARTMENT_OTHER): Payer: Self-pay | Admitting: Emergency Medicine

## 2023-06-03 ENCOUNTER — Other Ambulatory Visit: Payer: Self-pay

## 2023-06-03 ENCOUNTER — Emergency Department (HOSPITAL_BASED_OUTPATIENT_CLINIC_OR_DEPARTMENT_OTHER)
Admission: EM | Admit: 2023-06-03 | Discharge: 2023-06-03 | Discharge status: Against Medical Advice | Payer: Self-pay | Attending: Emergency Medicine | Admitting: Emergency Medicine

## 2023-06-03 DIAGNOSIS — Z5321 Procedure and treatment not carried out due to patient leaving prior to being seen by health care provider: Secondary | ICD-10-CM | POA: Insufficient documentation

## 2023-06-03 DIAGNOSIS — L0291 Cutaneous abscess, unspecified: Secondary | ICD-10-CM

## 2023-06-03 DIAGNOSIS — Z4800 Encounter for change or removal of nonsurgical wound dressing: Secondary | ICD-10-CM | POA: Insufficient documentation

## 2023-06-03 NOTE — ED Notes (Addendum)
Registration informed me that patient left. He reported that he was unable to stay any longer. Notified provider, Maryanna Shape, PA. He had not seen the patient yet.

## 2023-06-03 NOTE — ED Triage Notes (Signed)
Pt via pov from home after I&D last week. States a drain was placed in the wound and that it was supposed to fall out but he doesn't know if it has fallen out. Pt states he is still having some drainage. Pt denies pain. Pt alert & oriented, nad noted.

## 2023-06-07 NOTE — Plan of Care (Signed)
[facility] Tonsillectomy/Adenoidectomy, Postoperative PEDS care plan entered in error.

## 2023-08-16 ENCOUNTER — Emergency Department (HOSPITAL_BASED_OUTPATIENT_CLINIC_OR_DEPARTMENT_OTHER): Payer: No Typology Code available for payment source

## 2023-08-16 ENCOUNTER — Emergency Department (HOSPITAL_BASED_OUTPATIENT_CLINIC_OR_DEPARTMENT_OTHER)
Admission: EM | Admit: 2023-08-16 | Discharge: 2023-08-16 | Disposition: A | Discharge status: Home / Self Care | Payer: No Typology Code available for payment source | Attending: Emergency Medicine | Admitting: Emergency Medicine

## 2023-08-16 ENCOUNTER — Other Ambulatory Visit: Payer: Self-pay

## 2023-08-16 ENCOUNTER — Other Ambulatory Visit (HOSPITAL_BASED_OUTPATIENT_CLINIC_OR_DEPARTMENT_OTHER): Payer: Self-pay

## 2023-08-16 ENCOUNTER — Encounter (HOSPITAL_BASED_OUTPATIENT_CLINIC_OR_DEPARTMENT_OTHER): Payer: Self-pay | Admitting: Emergency Medicine

## 2023-08-16 DIAGNOSIS — Y92410 Unspecified street and highway as the place of occurrence of the external cause: Secondary | ICD-10-CM | POA: Insufficient documentation

## 2023-08-16 DIAGNOSIS — M542 Cervicalgia: Secondary | ICD-10-CM | POA: Insufficient documentation

## 2023-08-16 DIAGNOSIS — R519 Headache, unspecified: Secondary | ICD-10-CM | POA: Diagnosis not present

## 2023-08-16 MED ORDER — IBUPROFEN 600 MG PO TABS: 600.0000 mg | ORAL_TABLET | Freq: Four times a day (QID) | ORAL | 0 refills | Status: AC | PRN

## 2023-08-16 MED ORDER — METHOCARBAMOL 500 MG PO TABS: 500.0000 mg | ORAL_TABLET | Freq: Two times a day (BID) | ORAL | 0 refills | Status: AC | PRN

## 2023-08-16 MED ORDER — METHOCARBAMOL 500 MG PO TABS
500.0000 mg | ORAL_TABLET | Freq: Two times a day (BID) | ORAL | 0 refills | Status: DC | PRN
  Filled 2023-08-16: qty 20, 10d supply, fill #0

## 2023-08-16 MED ORDER — IBUPROFEN 600 MG PO TABS
600.0000 mg | ORAL_TABLET | Freq: Four times a day (QID) | ORAL | 0 refills | Status: DC | PRN
  Filled 2023-08-16: qty 30, 8d supply, fill #0
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
## 2023-08-16 NOTE — Discharge Instructions (Signed)
As discussed, imaging of your spine negative for any fracture or dislocation.  Suspect you have muscular injury in your neck area.  Will send you home with anti-inflammatories as well as muscle laxer to use as needed.  Note the muscle laxer can cause drowsiness so please do not drive or perform any high risk activity and to realize this medications effects on you.  Please do not hesitate to return if the worrisome signs and symptoms we discussed become apparent.
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
## 2024-06-20 ENCOUNTER — Other Ambulatory Visit (HOSPITAL_COMMUNITY): Payer: Self-pay
